# Patient Record
Sex: Female | Born: 1995 | Race: White | Hispanic: No | Smoking: Never smoker
Health system: Southern US, Academic
[De-identification: ages and names within clinical notes are randomized; demographics above are authoritative.]

## PROBLEM LIST (undated history)

## (undated) ENCOUNTER — Emergency Department (HOSPITAL_COMMUNITY): Admission: EM | Payer: No Typology Code available for payment source | Source: Home / Self Care

## (undated) DIAGNOSIS — F99 Mental disorder, not otherwise specified: Secondary | ICD-10-CM

## (undated) DIAGNOSIS — J45909 Unspecified asthma, uncomplicated: Secondary | ICD-10-CM

---
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
## 2015-02-04 ENCOUNTER — Emergency Department: Admission: EM | Admit: 2015-02-04 | Discharge: 2015-02-04 | Discharge status: Against Medical Advice | Payer: 59

## 2015-02-04 DIAGNOSIS — Z5320 Procedure and treatment not carried out because of patient's decision for unspecified reasons: Secondary | ICD-10-CM | POA: Insufficient documentation

## 2015-02-04 MED ORDER — SODIUM CHLORIDE 0.9 % IV BOLUS
1000.00 mL | INJECTION | Status: DC
Start: 2015-02-04 — End: 2015-02-04

## 2015-08-28 ENCOUNTER — Inpatient Hospital Stay (EMERGENCY_DEPARTMENT_HOSPITAL)
Admission: EM | Admit: 2015-08-28 | Discharge: 2015-08-28 | Disposition: A | Discharge status: Home / Self Care | Payer: 59

## 2015-08-28 ENCOUNTER — Emergency Department (HOSPITAL_COMMUNITY): Payer: Self-pay | Admitting: EMERGENCY MEDICINE

## 2015-08-28 DIAGNOSIS — D7389 Other diseases of spleen: Secondary | ICD-10-CM

## 2015-08-28 DIAGNOSIS — R1012 Left upper quadrant pain: Secondary | ICD-10-CM

## 2015-09-23 ENCOUNTER — Inpatient Hospital Stay (EMERGENCY_DEPARTMENT_HOSPITAL)
Admission: EM | Admit: 2015-09-23 | Discharge: 2015-09-23 | Disposition: A | Discharge status: Home / Self Care | Payer: 59

## 2015-09-23 DIAGNOSIS — J069 Acute upper respiratory infection, unspecified: Secondary | ICD-10-CM

## 2017-01-29 ENCOUNTER — Encounter (HOSPITAL_COMMUNITY): Payer: Self-pay

## 2017-01-29 ENCOUNTER — Emergency Department (EMERGENCY_DEPARTMENT_HOSPITAL): Payer: 59

## 2017-01-29 ENCOUNTER — Emergency Department
Admission: EM | Admit: 2017-01-29 | Discharge: 2017-01-29 | Disposition: A | Discharge status: Home / Self Care | Payer: 59 | Attending: Emergency Medicine | Admitting: Emergency Medicine

## 2017-01-29 ENCOUNTER — Emergency Department (HOSPITAL_COMMUNITY): Payer: 59

## 2017-01-29 DIAGNOSIS — R0989 Other specified symptoms and signs involving the circulatory and respiratory systems: Secondary | ICD-10-CM

## 2017-01-29 DIAGNOSIS — R05 Cough: Secondary | ICD-10-CM | POA: Insufficient documentation

## 2017-01-29 DIAGNOSIS — R0981 Nasal congestion: Secondary | ICD-10-CM | POA: Insufficient documentation

## 2017-01-29 DIAGNOSIS — Z79899 Other long term (current) drug therapy: Secondary | ICD-10-CM | POA: Insufficient documentation

## 2017-01-29 DIAGNOSIS — R059 Cough, unspecified: Secondary | ICD-10-CM

## 2017-01-29 LAB — RAPID INFLUENZA A/B AND RSV BY PCR
INFLUENZA VIRUS TYPE A: NEGATIVE
INFLUENZA VIRUS TYPE B: NEGATIVE
RESPIRATORY SYNCYTIAL VIRUS (RSV): NEGATIVE

## 2017-01-29 LAB — HCG, URINE QUALITATIVE, PREGNANCY
HCG URINE QUALITATIVE: NEGATIVE
HCG URINE QUALITATIVE: NEGATIVE

## 2017-01-29 LAB — RAPID THROAT SCREEN, STREPTOCOCCUS, WITH REFLEX: THROAT RAPID SCREEN, STREPTOCOCCUS: NEGATIVE

## 2017-01-29 MED ORDER — METHYLPREDNISOLONE 4 MG TABLETS IN A DOSE PACK
ORAL_TABLET | ORAL | 0 refills | Status: AC
Start: 2017-01-29 — End: ?

## 2017-01-29 MED ORDER — BENZONATATE 100 MG CAPSULE
100.00 mg | ORAL_CAPSULE | Freq: Three times a day (TID) | ORAL | 0 refills | Status: AC
Start: 2017-01-29 — End: 2017-02-03

## 2017-01-29 NOTE — ED Nurses Note (Signed)
Discharge papers and information provided. Pt verbalized understanding and denied any questions on information provided. Pt ambulatory out of ED.

## 2017-01-29 NOTE — ED Nurses Note (Signed)
Pt presents to the ED c/o of cough, sinus pressure, runny nose, and chest congestion past 2-3 days. Pt also reports of muscle aches. Pt denies any other complaints at this time.

## 2017-01-29 NOTE — Discharge Instructions (Signed)
Please return to the ED with any new and/or worsening of your symptoms.      Please take prescribed medications as indicated in as directed.      Please follow up your PCP as needed due to recent ED visit.  You may also be seen by [facility] at any time if you are student.      Please follow all discharge instructions including return precautions.      Thank you.

## 2017-01-29 NOTE — ED Nurses Note (Signed)
Pt ambulatory to xray.

## 2017-01-29 NOTE — ED Nurses Note (Signed)
Pt returned from xray

## 2017-01-29 NOTE — ED Nurses Note (Signed)
Flu and strep swab obtained per order and sent to lab. Pt ambulatory to restroom to provide urine sample

## 2017-01-29 NOTE — ED Provider Notes (Signed)
Department of Emergency Medicine  HPI - 01/29/2017    Attending: Dr. Sheliah Hatch  Resident/Advanced Practice Provider: Mena Goes, APRN    Chief Complaint:   Cough   Nasal Congestion   Body aches  History of Present Illness:   Shelley Carroll, 22 y.o. female   Pt reports to the ED via POV with c/o cough, nasal congestion, and body aches.    Patient is a 22 year old female who presents to the emergency department with cough, nasal congestion, body aches.  Patient states that she has had symptoms for approximately 3 days.  Patient states her symptoms have began to worsen.  Patient states that she took a leftover antibiotics at home over the last 3 days without relief of symptoms.  Patient states that she got a tattoo today and after returning home her symptoms worsened bring her to the emergency department.  Patient states that she is concerned that she may have lung cancer.  Patient has no history of such.  Patient denies daily medications.  Patient denies fever and/or recent injury.      History Limitations:   None    Review of Systems:   Constitutional: No fever, chills, or weakness   Skin: No rashes or diaphoresis   HENT: + congestion   Eyes: No vision changes or discharge   Cardio: No chest pain, palpitations, or leg swelling    Respiratory: + cough, no wheezing or SOB   GI:  No nausea, vomiting, diarrhea, constipation, or abdominal pain   GU:  No dysuria, hematuria, or polyuria   MSK: No joint or back pain, + body aches    Neuro: No loss of sensation, focal deficits, headaches, or LOC   Psych: No SI, HI, AV hallucinations, or substance abuse.     All other systems reviewed and are negative.    Medications:  Prior to Admission Medications   Prescriptions Last Dose Informant Patient Reported? Taking?   artificial tears with lanolin (LACRILUBE) Ophthalmic Ointment   No No   Sig: Instill into both eyes Every 4 hours as needed   diazePAM (VALIUM) 2 mg Oral Tablet   Yes No   Sig: Take 2 mg by mouth  Every 6 hours as needed for Anxiety   oxyCODONE (OXYCONTIN) 15 mg Oral tablet,oral only,ext.rel.12 hr   Yes No   Sig: Take 15 mg by mouth Every 12 hours      Facility-Administered Medications: None       Allergies:  No Known Allergies    Past Medical History:  No past medical history on file.        Past Surgical History:    No pertinent past surgical history reported by patient.        Social History:  Social History     Socioeconomic History   . Marital status: Single     Spouse name: Not on file   . Number of children: Not on file   . Years of education: Not on file   . Highest education level: Not on file   Social Needs   . Financial resource strain: Not on file   . Food insecurity - worry: Not on file   . Food insecurity - inability: Not on file   . Transportation needs - medical: Not on file   . Transportation needs - non-medical: Not on file   Occupational History   . Not on file   Tobacco Use   . Smoking status: Never Smoker   . Smokeless tobacco:  Never Used   Substance and Sexual Activity   . Alcohol use: Not Currently   . Drug use: Never   . Sexual activity: Not on file   Other Topics Concern   . Not on file   Social History Narrative   . Not on file       Family History:  Family Medical History:     None              Physical Exam:  All nurse's notes reviewed.  Filed Vitals:    01/29/17 0031 01/29/17 0259   BP: 118/70 117/75   Pulse: 80 84   Resp: 18 18   Temp: 36.7 C (98 F)    SpO2: 98% 98%          Constitutional: NAD. A+Ox3,   Appears ill however nontoxic.   HENT:    Head: NC AT,   Nasal congestion noted.   Mouth/Throat: Oropharynx is clear and moist.   Slight posterior erythema noted to the oropharynx.   Eyes: PERRL, EOMI, Conjunctivae without discharge.   Neck: Supple.    Cardiovascular: RRR, No murmurs, rubs, or gallops.    Pulmonary/Chest: BS equal bilaterally, good air movement. No respiratory distress. No wheezes, rales, or chest tenderness. Slight cough on exam.   Abdominal: BS +.  Abdomen soft. No tenderness, rebound, or guarding.                Musculoskeletal: No obvious deformity or swelling noted.     Skin: Warm and dry. No rash, erythema, pallor, or cyanosis.   Psychiatric: Behavior is normal. Mood and affect congruent.     Neurological: Alert&Ox3. Grossly intact.     Labs:  No results found for this or any previous visit (from the past 24 hour(s)).    Imaging:  XR CHEST PA AND LATERAL   Final Result   No acute cardiopulmonary abnormality.                         Orders Placed This Encounter   . INFLUENZA A/B AND RSV BY PCR   . RAPID THROAT SCREEN, STREPTOCOCCUS, WITH CULTURE REFLEX   . CANCELED: THROAT CULTURE, BETA HEMOLYTIC STREPTOCOCCUS   . XR CHEST PA AND LATERAL   . HCG, URINE QUALITATIVE, PREGNANCY   . benzonatate (TESSALON) 100 mg Oral Capsule   . Methylprednisolone (MEDROL DOSEPACK) 4 mg Oral Tablets, Dose Pack       Abnormal Lab results:  Labs Reviewed   RAPID INFLUENZA A/B AND RSV BY PCR - Normal   RAPID THROAT SCREEN, STREPTOCOCCUS, WITH REFLEX - Normal   HCG, URINE QUALITATIVE, PREGNANCY - Normal    Narrative:     Sensitivity of the qualitative pregnancy test is 20 mIU hCG/mL.  If pregnancy is strongly considered, perform quantitative hCG or repeat in 48  hours.       ECG:   Not indicated.    Plan: Appropriate labs and imaging ordered. Medical Records reviewed.    Therapy/Procedures/Course/MDM:   Patient was vitally stable throughout visit.    Imaging remarkable for:   See above.  No acute findings.  Lab results remarkable for:   See above.  Preg neg.  Strep neg.  Flu neg.    Patient received:   No medications in the ED.  Prescription for Tessalon a Medrol Dosepak.  Results discussed with patient.  she had improvement with initial ED management. she was given the opportunity to ask questions.  Agreeable to the current plan of care and ER disposition of discharge.  Patient has friend at bedside.  Patient is acting appropriate, talking, and passed p.o. Challenge.   PCP  follow-up as needed.    Medication as prescribed.  Maintain hydration.    Patient follow all discharge instructions including return precautions.    Patient discharged home.  Consults:      None  Impression:   Encounter Diagnoses   Name Primary?   . Cough Yes   . Nasal congestion      Disposition:  Discharged     Following the above history, physical exam, and studies, the patient was deemed stable and suitable for discharge.   she will follow up with  PCP as needed.     Medrol Dosepak and Tessalon was prescribed.  Medication instructions were discussed with the patient   It was advised that the patient return to the ED with any new, concerning or worsening symptoms and follow up as directed.    The patient verbalized understanding of all instructions and had no further questions or concerns.     The supervising physician was physically present and available for consultation, and did not physically see the patient.    Jonell CluckLevi D Wichterman, APRN,NP-C  02/05/2017, 17:59

## 2017-01-31 LAB — THROAT CULTURE, BETA HEMOLYTIC STREPTOCOCCUS: THROAT CULTURE: NORMAL

## 2017-02-05 NOTE — ED Attending Note (Signed)
========================================================================================================================================    I was present and available to evaluate this patient while in the emergency department. This is not an attestation of having personally seen and evaluated the patient, but I was available for consultation.  I was not asked to provide input into this patient's care.  Odis Lusterebra Jo Chapman Matteucci, MD  02/05/2017, 18:07

## 2017-09-01 ENCOUNTER — Encounter (HOSPITAL_BASED_OUTPATIENT_CLINIC_OR_DEPARTMENT_OTHER): Payer: Self-pay | Admitting: Obstetrics & Gynecology

## 2017-09-15 ENCOUNTER — Encounter (HOSPITAL_BASED_OUTPATIENT_CLINIC_OR_DEPARTMENT_OTHER): Payer: Self-pay | Admitting: Student in an Organized Health Care Education/Training Program

## 2017-12-04 ENCOUNTER — Encounter (HOSPITAL_BASED_OUTPATIENT_CLINIC_OR_DEPARTMENT_OTHER): Payer: Self-pay | Admitting: CERTIFIED NURSE MIDWIFE

## 2018-01-27 NOTE — L&D Delivery Note (Signed)
[facility]    Delivery Summary Note      Name: Shelley Carroll   MRN: [id number]  DOB:  06-06-1995  Admitted: 03/11/2018  1:15 PM       Pre-delivery diagnosis:    1. 22 y.o. G4P0030 at [redacted]w[redacted]d gestation.   2. Anxiety/depression    Post-delivery diagnosis:    1. Same plus delivery of a viable neonate.     Findings:           After pushing with mom and mixed maternal expulsive efforts, viable female delivered over an intact perineum in OA position with spontaneous restitution to LOT. Mom then refusing to push further and asking for baby to be "pulled out". Loose nuchal cord reduced. Shoulder clear of pubic bone but maternal efforts weak. After coaching mom able to push baby out. Cord clamped x2 and cut by FOB. Baby to warmer for resuscitation per nursery staff. Cord blood collected and to table. Placenta delivered. Complete, intact with 3vc. Fundus firm to pit and massage. After inspection perineum found to be without laceration. Bleeding minimal.     See below for delivery details.       Disposition:  Infant(s) admitted to floor (rooming in with mother).    Stuckwisch, Girl [[id number]]    Labor Events    Preterm labor?:  No  Antibiotics received during labor?:  No  Rupture date/time:  03/11/2018  Rupture type:  Spontaneous  Fluid color:  Pink  Augmentation:  Oxytocin          Delivery Anesthesia    Method:  Epidural     Lacerations    Episiotomy:  None   Perineal lacerations:  None       Delivery Information    Birth date/time:  03/12/2018 0454  Sex:  Female  Delivery type:  Vaginal, Spontaneous     Newborn Presentation    Presentation:  Vertex  Position:  Occiput Anterior     Cord Information    Vessels:  3 Vessels  Complications:  Nuchal  Nuchal intervention:  reduced  Nuchal cord description:  loose nuchal cord  Number of loops:  1  Cord blood disposition:  Lab, Cord Segment Sent  Gases sent?:  No     Newborn  Apgars    Living status:  Living      Skin color:     Heart rate:     Reflex Irrit:     Muscle tone:       Resp. effort:     Total:      1 Min:      5 Min:      10 Min:      15 Min:      20 Min:           Placenta    Removal:  Spontaneous  Appearance:  Intact  Disposition:  Discarded     Other Delivery Procedures    Procedures:  None     Delivery Providers    Delivering Clinician:  Barnetta Chapel, APRN,MIDWIFE   Provider Role    Registered Nurse    Baby Nurse                      Barnetta Chapel, APRN,MIDWIFE 03/12/2018, 05:32    The patient was seen and independently evaluated by the CNM.      Lennie Muckle, MD

## 2018-03-11 ENCOUNTER — Inpatient Hospital Stay
Admission: EM | Admit: 2018-03-11 | Discharge: 2018-03-14 | DRG: 560 | Disposition: A | Discharge status: Home / Self Care | Payer: No Typology Code available for payment source | Source: Ambulatory Visit | Attending: OBSTETRICS/GYNECOLOGY | Admitting: OBSTETRICS/GYNECOLOGY

## 2018-03-11 ENCOUNTER — Inpatient Hospital Stay (HOSPITAL_COMMUNITY): Payer: 59 | Admitting: OBSTETRICS/GYNECOLOGY

## 2018-03-11 ENCOUNTER — Encounter (HOSPITAL_COMMUNITY): Payer: Self-pay

## 2018-03-11 ENCOUNTER — Encounter (HOSPITAL_COMMUNITY): Payer: Self-pay | Admitting: Student in an Organized Health Care Education/Training Program

## 2018-03-11 ENCOUNTER — Other Ambulatory Visit: Payer: Self-pay

## 2018-03-11 ENCOUNTER — Encounter (HOSPITAL_COMMUNITY): Payer: 59 | Admitting: Student in an Organized Health Care Education/Training Program

## 2018-03-11 DIAGNOSIS — O99344 Other mental disorders complicating childbirth: Secondary | ICD-10-CM | POA: Diagnosis present

## 2018-03-11 DIAGNOSIS — Z9189 Other specified personal risk factors, not elsewhere classified: Secondary | ICD-10-CM

## 2018-03-11 DIAGNOSIS — Z3A38 38 weeks gestation of pregnancy: Secondary | ICD-10-CM

## 2018-03-11 DIAGNOSIS — F4322 Adjustment disorder with anxiety: Secondary | ICD-10-CM | POA: Diagnosis present

## 2018-03-11 DIAGNOSIS — Z3490 Encounter for supervision of normal pregnancy, unspecified, unspecified trimester: Secondary | ICD-10-CM

## 2018-03-11 DIAGNOSIS — F329 Major depressive disorder, single episode, unspecified: Secondary | ICD-10-CM | POA: Diagnosis present

## 2018-03-11 HISTORY — DX: Mental disorder, not otherwise specified: F99

## 2018-03-11 HISTORY — DX: Unspecified asthma, uncomplicated: J45.909

## 2018-03-11 LAB — ALT (SGPT): ALT (SGPT): 13 U/L (ref <55)

## 2018-03-11 LAB — CREATININE WITH EGFR
CREATININE: 0.74 mg/dL (ref 0.49–1.10)
ESTIMATED GFR: >60 mL/min/1.73mˆ2 (ref >60)

## 2018-03-11 LAB — CBC
HCT: 38.1 % (ref 34.8–46.0)
HGB: 12.8 g/dL (ref 11.5–16.0)
MCH: 27.5 pg (ref 26.0–32.0)
MCH: 27.5 pg (ref 26.0–32.0)
MCHC: 33.6 g/dL (ref 31.0–35.5)
MCV: 81.8 fL (ref 78.0–100.0)
MPV: 10.9 fL (ref 8.7–12.5)
PLATELETS: 230 x10ˆ3/uL (ref 150–400)
RBC: 4.66 x10ˆ6/uL (ref 3.85–5.22)
RDW-CV: 14.1 % (ref 11.5–15.5)
WBC: 16.6 x10?3/uL — ABNORMAL HIGH (ref 3.7–11.0)
WBC: 16.6 x10ˆ3/uL — ABNORMAL HIGH (ref 3.7–11.0)

## 2018-03-11 LAB — HEPATITIS C ANTIBODY SCREEN WITH REFLEX TO HCV PCR: HCV ANTIBODY QUALITATIVE: NEGATIVE

## 2018-03-11 LAB — HIV1/HIV2 SCREEN, COMBINED ANTIGEN AND ANTIBODY: HIV SCREEN, COMBINED ANTIGEN & ANTIBODY: NEGATIVE

## 2018-03-11 LAB — DRUG SCREEN, NO CONFIRMATION, URINE
AMPHETAMINES URINE: NEGATIVE
BARBITURATES URINE: NEGATIVE
BENZODIAZEPINES URINE: NEGATIVE
BUPRENORPHINE URINE: NEGATIVE
CANNABINOIDS URINE: NEGATIVE
COCAINE METABOLITES URINE: NEGATIVE
CREATININE RANDOM URINE: 18 mg/dL
ECSTASY/MDMA URINE: NEGATIVE
METHADONE URINE: NEGATIVE
OPIATES URINE (LOW CUTOFF): NEGATIVE
OXIDANT-ADULTERATION: NEGATIVE
OXYCODONE URINE: NEGATIVE
PH-ADULTERATION: 6.6 (ref 4.5–9.0)
SPECIFIC GRAVITY-ADULTERATION: 1.005 g/mL (ref 1.005–1.030)
SPECIFIC GRAVITY-ADULTERATION: 1.005 g/mL (ref 1.005–1.030)

## 2018-03-11 LAB — TYPE AND SCREEN
ABO/RH(D): AB POS
ANTIBODY SCREEN: NEGATIVE

## 2018-03-11 LAB — URIC ACID: URIC ACID: 4.8 mg/dL (ref 2.9–6.3)

## 2018-03-11 LAB — ELECTROLYTES
ANION GAP: 10 mmol/L (ref 4–13)
CHLORIDE: 106 mmol/L (ref 96–111)
CO2 TOTAL: 21 mmol/L — ABNORMAL LOW (ref 22–32)
POTASSIUM: 3.7 mmol/L (ref 3.5–5.1)
SODIUM: 137 mmol/L (ref 136–145)

## 2018-03-11 LAB — AST (SGOT): AST (SGOT): 29 U/L (ref 8–41)

## 2018-03-11 LAB — HEPATITIS B SURFACE ANTIBODY: HBV SURFACE ANTIBODY QUANTITATIVE: 0 m[IU]/mL (ref <8)

## 2018-03-11 LAB — LDH: LDH: 273 U/L — ABNORMAL HIGH (ref 125–220)

## 2018-03-11 MED ORDER — SODIUM CHLORIDE 0.9 % (FLUSH) INJECTION SYRINGE
2.0000 mL | INJECTION | INTRAMUSCULAR | Status: DC | PRN
Start: 2018-03-11 — End: 2018-03-12

## 2018-03-11 MED ORDER — LIDOCAINE HCL 10 MG/ML (1 %) INJECTION SOLUTION
2.0000 mL | Freq: Once | INTRAMUSCULAR | Status: DC | PRN
Start: 2018-03-11 — End: 2018-03-14

## 2018-03-11 MED ORDER — FENTANYL 2MCG/ML + ROPIVACAINE 0.2% IN NS 250ML (TOT VOL) PCEA INFUSION
INJECTION | Status: DC
Start: 2018-03-11 — End: 2018-03-14
  Administered 2018-03-11: 10 mL/h via EPIDURAL
  Filled 2018-03-11: qty 250

## 2018-03-11 MED ORDER — METHYLERGONOVINE 0.2 MG/ML (1 ML) INJECTION SOLUTION
0.2000 mg | Freq: Once | INTRAMUSCULAR | Status: DC | PRN
Start: 2018-03-11 — End: 2018-03-14

## 2018-03-11 MED ORDER — SODIUM CHLORIDE 0.9 % (FLUSH) INJECTION SYRINGE
2.0000 mL | INJECTION | Freq: Three times a day (TID) | INTRAMUSCULAR | Status: DC
Start: 2018-03-11 — End: 2018-03-12
  Administered 2018-03-11 (×2): 0 mL

## 2018-03-11 MED ORDER — OXYTOCIN 30 UNIT/500 ML IN 0.9 % SODIUM CHLORIDE INTRAVENOUS
1.0000 m[IU]/min | INTRAVENOUS | Status: DC
Start: 2018-03-12 — End: 2018-03-12
  Administered 2018-03-12: 1 m[IU]/min via INTRAVENOUS
  Filled 2018-03-11: qty 500

## 2018-03-11 MED ORDER — MISOPROSTOL 200 MCG TABLET
1000.0000 ug | ORAL_TABLET | Freq: Once | ORAL | Status: DC | PRN
Start: 2018-03-11 — End: 2018-03-14

## 2018-03-11 MED ORDER — CARBOPROST TROMETHAMINE 250 MCG/ML INTRAMUSCULAR SOLUTION
250.0000 ug | Freq: Once | INTRAMUSCULAR | Status: DC | PRN
Start: 2018-03-11 — End: 2018-03-14

## 2018-03-11 MED ORDER — LACTATED RINGERS INTRAVENOUS SOLUTION
INTRAVENOUS | Status: DC
Start: 2018-03-11 — End: 2018-03-14
  Administered 2018-03-12: 0 via INTRAVENOUS

## 2018-03-11 MED ORDER — LIDOCAINE HCL 10 MG/ML (1 %) INJECTION SOLUTION
Freq: Once | INTRAMUSCULAR | Status: DC | PRN
Start: 2018-03-11 — End: 2018-03-12
  Administered 2018-03-11: 4 mL

## 2018-03-11 MED ORDER — LIDOCAINE-EPINEPHRINE (PF) 1.5 %-1:200,000 INJECTION SOLUTION
Freq: Once | INTRAMUSCULAR | Status: DC | PRN
Start: 2018-03-11 — End: 2018-03-12
  Administered 2018-03-11: 3 mL via EPIDURAL

## 2018-03-11 MED ORDER — ROPIVACAINE (PF) 2 MG/ML (0.2 %) INJECTION SOLUTION
Freq: Once | INTRAMUSCULAR | Status: DC | PRN
Start: 2018-03-11 — End: 2018-03-12
  Administered 2018-03-11 (×2): 5 mL

## 2018-03-11 MED ORDER — MINERAL OIL ORAL
30.00 mL | TOPICAL_OIL | Freq: Every day | ORAL | Status: DC | PRN
Start: 2018-03-11 — End: 2018-03-14
  Administered 2018-03-12: 30 mL via ORAL
  Filled 2018-03-11: qty 60

## 2018-03-11 MED ADMIN — mupirocin 2 % topical ointment: @ 16:00:00 | NDC 68462018022

## 2018-03-11 NOTE — Nurses Notes (Signed)
Order received for discharge.  Patient to go to [facility], which is where she receives her OB care, to be evaluated for labor.  Patient is leaving with significant other.

## 2018-03-11 NOTE — Anesthesia Preprocedure Evaluation (Addendum)
ANESTHESIA PRE-OP EVALUATION  Planned Procedure: ANES - LABOR ANALGESIA  Review of Systems     anesthesia history negative     patient summary reviewed  nursing notes reviewed        Pulmonary   asthma (no active medications/asymptomatic),   Cardiovascular  negative cardio ROS,  NYHA Classification:I   Exercise Tolerance: > or = 4 METS        GI/Hepatic/Renal   negative GI/hepatic/renal ROS,      Endo/Other   neg endo/other ROS,        Neuro/Psych/MS   negative neuro/psych ROS,      Cancer  negative hematology/oncology ROS,                  Physical Assessment      Patient summary reviewed and Nursing notes reviewed   Airway       Mallampati: II    TM distance: >3 FB    Neck ROM: full  Mouth Opening: good.  No Facial hair  No Beard  No endotracheal tube present  No Tracheostomy present    Dental       Dentition intact             Pulmonary    Breath sounds clear to auscultation  (-) no rhonchi, no decreased breath sounds, no wheezes, no rales and no stridor     Cardiovascular    Rhythm: regular  Rate: Normal  (-) no friction rub, carotid bruit is not present and no murmur     Other findings            Plan  Planned anesthesia type: epidural        ASA 2         Anesthetic plan and risks discussed with patient.     Anesthesia issues/risks discussed are: Spinal Headache, Failure of Block, Nerve Injuries, Cardiac Events/MI, Local Anesthetic Systemic Toxicity, High Neuraxial Block and PONV.    Use of blood products discussed with patient who consented to blood products.                Plan discussed with resident and attending.

## 2018-03-11 NOTE — Discharge Instructions (Addendum)
Patient Education   Patient Education     After a Vaginal Birth    After having a baby, your body may be very tired. It can take time to recover from a vaginal delivery. You may stay in the hospital or birth center from 1 to 4 days.In some cases, you may be able to go home the same day.  Right after the delivery  Your temperature and blood pressure will be taken until they are stable. A nurse or other healthcare provider will observe you as you rest. You may have afterbirth pains. These are cramps caused by the uterus shrinking. Sanitary pads are used to soak up the discharge of the uterine lining. To make sure that you aren't bleeding too much, the pad will be checked. And the firmness of your uterus will be checked. To do this, a nurse will gently push down on your stomach. If you had anesthesia, you'll be watched closely until you can feel and move your toes. If you have perineal pain (pain between the vagina and anus), an ice pack can help.  Newborn care  While still in the hospital or birth center, you'll learn how to hold and feed your baby. You willalso be given instructions on how to care for your baby. This includes bathing and feeding.  Preparing to go home  You may be anxious to go home as soon as possible. Before you and your baby go home, a healthcare provider will check to be sure you are healthy enough to take care of your baby and yourself. You're ready to go home when:   You can walk to the bathroom and use the bathroom without help.   You can eat solid food and swallow pills (if needed).   You have no sign of infection or other health problems, including fever.   You have adequate pain control.   Your bleeding isn't excessive.   You are able to care for your newborn and are emotionally stable.  Before leaving the hospital or birth center, you'll be given written instructions for home self-care after vaginal delivery. Be sure to follow these instructions carefully. If you have questions or  concerns, talk about them now.  If you have stitches  You may have received stitches in the skin near your vagina. The stitches might have closed an episiotomy (an incision that enlarges the opening of the vagina). Or you may have needed stitches to repair torn skin. Either way, your stitches should dissolve within weeks. Until then, you can help reduce discomfort, aid healing, and reduce your risk of infection by keeping the stitches clean. These tips can help:   Gently wipe from front to back after you urinate or have a bowel movement.   After wiping, spray warm water on the area. Or you can have a sitz bath. This means sitting in a tub with a few inches of water in it. Then pat the area dry or use a hairdryer on a cool setting.   Do not use soap or any solution except water on the area.   You can take a shower unless told not to.   Change sanitary pads at least every 2 to 4 hours.   Place cold or heat packs on the area as directed by your healthcare providers or nurses. Keep a thin towel between the pack and your skin.   Sit on firm seats so the stitches pull less.  Postnatal follow-up  Schedule a postnatal follow-up exam with your   healthcare provider for about 6 weeks after delivery. During this exam, your uterus and vaginal area will be checked. Contact your healthcare provider if you think you or your baby are having any problems.  When to call your healthcare provider  Call your healthcare provider right away if you have:   A fever of 100.4F (38.0C) or higher   Bleeding that needs a new sanitary pad after an hour, or large blood clots   Pain in your vagina that gets worse and isn't relieved with medicine   Swelling, discharge, or increased pain from vaginal tear or episiotomy   Burning, pain, red streaks, or lumpy areas in your breasts that may be accompanied by flu-like symptoms   Cracks, blisters, or blood on your nipples   Burning or pain when you urinate   Nausea or vomiting   Dizziness  or fainting   Feelings of extreme sadness or anxiety, or a feeling that you don't want to be with your baby   Belly pain that isn't relieved with medicine   Vaginal discharge that has a bad odor   No bowel movement for 5 days   Painful urination, or inability to control urination   Redness, warmth, or pain in the lower leg   Chest pain  [employer] last reviewed this educational content on 12/28/2015   2000-2019 [employer] [address], [place] [address]. All rights reserved. This information is not intended as a substitute for professional medical care. Always follow your healthcare professional's instructions.           Understanding Postpartum Depression  You've just had a baby. You expected to be excited and happy. But instead you find yourself crying for no reason. You may have trouble coping with your daily tasks. You feel sad, tired, and hopeless most of the time. You may even feel ashamed or guilty. But what you're going through is not your fault and you can feel better. Talk to your healthcare provider. He or she can help.    What is depression?  Depression is a mood disorder that affects the way you think and feel. The most common symptom is a feeling of deep sadness. You may also feel as if you just can't cope with life. Other symptoms include:   Gaining or losing a lot of weight   Sleeping too much or too little   Feeling tired all the time   Feeling restless   Crying a lot   Having too little or too much appetite.   Withdrawing from friends and family   Having headaches, aches and pains, or stomach problems that won't go away.   Fears of harming your baby   Lack of interest in your baby   Feeling worthless or guilty   No longer finding pleasure in things you used to   Having trouble thinking clearly or making decisions   Thinking about death or suicide  Depression after childbirth  You may be weepy and tired right after giving birth. These feelings are normal.  They're sometimes called the "baby blues." These blues go away after 1 to 2 weeks. However, postpartum (meaning "after birth") depression lasts much longer and is more severe than the "baby blues." It can make you feel sad and hopeless. You may also fear that your baby will be harmed and worry about being a bad mother.  What causes postpartum depression?  The exact cause of postpartum depression is unknown. Changes in brain chemistry or structure   are believed to play a big role in depression.It may be due to changes in your hormones during and after childbirth. You may also be tired from caring for your baby and adjusting to being a mother. All these factors may make you feel depressed. In some cases, your genes may also play a role.  Depression can be treated  There are many ways to treat postpartum depression. Talking to your healthcare provider is the first step toward feeling better.  When to call your healthcare provider  Call your healthcare provider if you:   Cry for no clear reason   Have trouble sleeping, eating, and making choices   Questions whether you can handle caring for a baby   Have intense feelings of sadness, anxiety, or despair that prevent you from being able to do your daily tasks  Resources   National Institute of Mental Health866-615-6464www.nimh.nih.gov   National Alliance on Mental Illness800-950-6264www.nami.org   Mental Health America800-969-6642www.nmha.org   National Suicide Hotline800-784-2433 (800-SUICIDE)    [employer] last reviewed this educational content on 07/28/2015   2000-2019 [employer] [address], [place] [address]. All rights reserved. This information is not intended as a substitute for professional medical care. Always follow your healthcare professional's instructions.

## 2018-03-11 NOTE — Progress Notes (Signed)
L&D Labor Progress Note: CNM  PATIENT: Shelley Carroll  CHART NUMBER: [id number]  DoB: 1995/05/02   DATE OF SERVICE: 03/11/2018, 22:12      S: Patient in bed, with good pain control from epidural, sitting upright in bed rocking hips. Reports she cannot feel contractions at all, but likes feeling like she is still moving.     O:   Filed Vitals:    03/11/18 1756 03/11/18 1849 03/11/18 2011 03/11/18 2208   BP: 128/62 111/85 119/63 129/81   Pulse: 100 94 92 (!) 104   Resp:  16 18    Temp:  36.9 C (98.4 F) 36.5 C (97.7 F)    SpO2:           SVE: 7/90/-1    Variability: Minimal with some periods of moderate  Baseline: 150  Accelerations: present  Decelerations: none  Toco:q 2-5 min    A/P: 23 y.o. G4P0030 @ Unknown     Active labor   SVE 7/90/-1  EFM Cat 1   CTX q 2-5 min on Toco  Can consider pitocin if her contractions start to space out.   Will try peanut ball in exaggerated simms.     Barnetta Chapel, APRN,MIDWIFE  03/11/2018, 22:12  The patient was seen and independently evaluated by the CNM.      Lennie Muckle, MD

## 2018-03-11 NOTE — Anesthesia Procedure Notes (Signed)
Lumbar Epidural   Indication: pain relief in labor and delivery        Pt location: At bedside  Technique: ( See MAR for exact doses)  Technique/Approach: midline      Needle Level: L4-5  Sterile Skin Prep : cap, draped, hand hygiene performed, mask, sterile drape, sterile field established, sterile gloves, sterile technique and sterilely prepped and draped     Site verified, H&P updated and consent obtained, Patient monitors applied, Timeout performed, Emergency drugs and equipment available, Patient positioned and anesthesia consent given   Patient position: sitting   Skin local: Lidocaine 1%   Needle/Catheter: Needle type: Hustead   Needle Gauge: 18 G  Needle length: 3.5 in    Needle insertion depth 5.5 cm Catheter length in space: 5 cm   Catheter at skin depth: 10.5 cm  Epidural catheter location: lumbar (1-5)  Number of attempts: 1  Events: neg aspiration and no complications,         Dosing:       lidocaine 1.5% with epinephrine 1:200,000       Negative test - not INTRAVENOUS  and Negative test - not Subarachnoid     Catheter: secured         Patient response: comfortable and adequate sensory block    Performed by:    Performing Provider:  Jasmine Pang, MD  Authorizing Provider:  Erlene Senters, MD

## 2018-03-11 NOTE — H&P (Signed)
[facility] of Obstetrics & Gynecology      Obstetrics History and Physical    PATIENT:  Shelley Carroll   MRN:  [id number]   DATE OF SERVICE:  03/11/2018, 14:04      PRIMARY OB:  [facility]      CC:  Contractions    HPI:  Kamiylah Meece is a 23 y.o. G1P0 at [redacted]w[redacted]d GA by LMP and [place] who presents to labor and delivery for contractions.  She reports she got all her prenatal care there and was just unsure about where to go when they told her to go to the hospital in labor. After some thought, she says she would like to go to [facility] if she can. Later became very uncomfortable, and saying she "feels like she might die" and writhing on the bed pain during contractions. Then decided to stay and be admitted here.     ROD:  Based on sure LMP    OBHx:  Lab Results   Component Value Date    HGB 12.8 03/11/2018    HGB 12.1 06/08/2014    HCT 38.1 03/11/2018    HCT 36.8 06/08/2014     Rubella:  immune     HIV:  neg     VDRL:  neg     Hep B SAG:  neg     GC:  neg     GBS:  negative    OB History   Gravida Para Term Preterm AB Living   4   0 0 3 0   SAB TAB Ectopic Multiple Live Births   0 3 0 0        # Outcome Date GA Lbr Len/2nd Weight Sex Delivery Anes PTL Lv   4 Current            3 TAB            2 TAB            1 TAB                PMHx:  No past medical history on file.      FamHx:   Father with heart disease, denies hx of bleeding or genetics disorders      SocHx:  Denies drug use  Social History     Tobacco Use   . Smoking status: Never Smoker   . Smokeless tobacco: Never Used   Substance Use Topics   . Alcohol use: Not Currently   . Drug use: Never   Vaping earlier in prenancy     CURRENT MEDS:   Medications Prior to Admission     Prescriptions    Methylprednisolone (MEDROL DOSEPACK) 4 mg Oral Tablets, Dose Pack    Take as instructed.           ALLERGIES:  Patient has no known allergies.     REVIEW OF SYSTEMS: Other than ROS in the HPI, all other systems were negative.    PHYSICAL EXAMINATION:   Filed  Vitals:    03/11/18 1540 03/11/18 1541 03/11/18 1543 03/11/18 1546   BP: (!) 137/97  126/68 132/69   Pulse: 96 97 97 93   SpO2:  100%  100%     Check Vitals  GEN:  appears in good health, normal mood and affect  HEENT:  EOMI, CN II-XII grossly intact  CV:  regular rate and rhythm  RESP:  Clear to auscultation bilaterally.   ABD:  Soft, non-tender; gravid  EXT:  No cyanosis or edema  NEURO:  2+ reflexes bilateral patellar  SKIN:  No apparent lesions, no rash    SVE:  2/90/-1  Grossly ruptured on exam with small amount of pink tinged fluid  PRESENTATION:  Cephalic by leopolds  EFW:   At 18th percentile 2/11 [place] per [facility]    FHRT:  155 baseline, mod and minimal variability, pos accels, variable decels  TOCO:  q 2-4 min    ASSESSMENT/PLAN:  23 y.o. G1P0 at [redacted]w[redacted]d dated by LMP    PNC   ABPOS per records   PNL WNL   Continue PNV    1.  Labor    Admit to floor    Continuous monitoring   IV fluids   Clear liquid diet   PN labs ordered as records from [facility] incomplete   Anticipate NSVD     2.  Pain control    Anesthesia aware, patient consented    Barnetta Chapel, APRN,MIDWIFE           I reviewed the APP's note.  I agree with the findings and plan of care as documented in the APP's note.  Any exceptions/additions are edited/noted.    Delmar Landau, MD

## 2018-03-12 ENCOUNTER — Encounter (HOSPITAL_COMMUNITY): Payer: Self-pay

## 2018-03-12 DIAGNOSIS — Z3A38 38 weeks gestation of pregnancy: Secondary | ICD-10-CM

## 2018-03-12 MED ORDER — SODIUM CHLORIDE 0.9 % (FLUSH) INJECTION SYRINGE
2.0000 mL | INJECTION | INTRAMUSCULAR | Status: DC | PRN
Start: 2018-03-12 — End: 2018-03-14

## 2018-03-12 MED ORDER — IBUPROFEN 600 MG TABLET
600.0000 mg | ORAL_TABLET | Freq: Four times a day (QID) | ORAL | Status: DC
Start: 2018-03-12 — End: 2018-03-12

## 2018-03-12 MED ORDER — OXYTOCIN 30 UNIT/500 ML IN 0.9 % SODIUM CHLORIDE INTRAVENOUS
334.0000 m[IU]/min | INTRAVENOUS | Status: DC
Start: 2018-03-12 — End: 2018-03-12

## 2018-03-12 MED ORDER — ACETAMINOPHEN 325 MG TABLET
650.0000 mg | ORAL_TABLET | ORAL | Status: DC | PRN
Start: 2018-03-12 — End: 2018-03-14

## 2018-03-12 MED ORDER — DOCUSATE SODIUM 100 MG CAPSULE
100.0000 mg | ORAL_CAPSULE | Freq: Two times a day (BID) | ORAL | Status: DC | PRN
Start: 2018-03-12 — End: 2018-03-14
  Administered 2018-03-12: 100 mg via ORAL
  Filled 2018-03-12: qty 1

## 2018-03-12 MED ORDER — LIDOCAINE HCL 20 MG/ML (2 %) INJECTION SOLUTION
INTRAMUSCULAR | Status: DC
Start: 2018-03-12 — End: 2018-03-12
  Filled 2018-03-12: qty 20

## 2018-03-12 MED ORDER — SODIUM CHLORIDE 0.9 % (FLUSH) INJECTION SYRINGE
2.0000 mL | INJECTION | Freq: Three times a day (TID) | INTRAMUSCULAR | Status: DC
Start: 2018-03-12 — End: 2018-03-14
  Administered 2018-03-12: 2 mL
  Administered 2018-03-12 – 2018-03-14 (×6): 0 mL

## 2018-03-12 MED ORDER — LANOLIN-OXYQUIN-PET, HYDROPHIL TOPICAL OINTMENT
TOPICAL_OINTMENT | Freq: Two times a day (BID) | CUTANEOUS | Status: DC | PRN
Start: 2018-03-12 — End: 2018-03-14
  Filled 2018-03-12: qty 1

## 2018-03-12 MED ORDER — HYDROCODONE 5 MG-ACETAMINOPHEN 325 MG TABLET
1.0000 | ORAL_TABLET | ORAL | Status: DC
Start: 2018-03-12 — End: 2018-03-12

## 2018-03-12 MED ORDER — KETOROLAC 30 MG/ML (1 ML) INJECTION SOLUTION
30.0000 mg | Freq: Once | INTRAMUSCULAR | Status: AC
Start: 2018-03-12 — End: 2018-03-12
  Administered 2018-03-12: 30 mg via INTRAVENOUS
  Filled 2018-03-12: qty 1

## 2018-03-12 MED ORDER — BISACODYL 10 MG RECTAL SUPPOSITORY
10.0000 mg | Freq: Once | RECTAL | Status: AC | PRN
Start: 2018-03-12 — End: 2018-03-12

## 2018-03-12 MED ORDER — IBUPROFEN 600 MG TABLET
600.00 mg | ORAL_TABLET | Freq: Four times a day (QID) | ORAL | Status: DC
Start: 2018-03-12 — End: 2018-03-14
  Administered 2018-03-12: 0 mg via ORAL
  Administered 2018-03-12: 600 mg via ORAL
  Administered 2018-03-13 (×2): 0 mg via ORAL
  Administered 2018-03-13: 600 mg via ORAL
  Administered 2018-03-13: 0 mg via ORAL
  Administered 2018-03-14 (×2): 600 mg via ORAL
  Filled 2018-03-12 (×7): qty 1

## 2018-03-12 MED ORDER — GLYCERIN-WITCH HAZEL 12.5 %-50 % TOPICAL PADS
MEDICATED_PAD | CUTANEOUS | Status: DC | PRN
Start: 2018-03-12 — End: 2018-03-14
  Filled 2018-03-12: qty 40

## 2018-03-12 MED ORDER — ONDANSETRON HCL (PF) 4 MG/2 ML INJECTION SOLUTION
4.0000 mg | Freq: Once | INTRAMUSCULAR | Status: DC | PRN
Start: 2018-03-12 — End: 2018-03-14

## 2018-03-12 MED ORDER — ONDANSETRON HCL 4 MG TABLET
4.0000 mg | ORAL_TABLET | Freq: Three times a day (TID) | ORAL | Status: AC | PRN
Start: 2018-03-12 — End: 2018-03-13

## 2018-03-12 NOTE — Care Management Notes (Signed)
[facility] Management Initial Evaluation    Patient Name: Shelley Carroll  Date of Birth: Dec 03, 1995  Sex: female  Date/Time of Admission: 03/11/2018  1:15 PM  Room/Bed: 629/A  Payor: Theme park manager / Plan: Theme park manager [facility] / Product Type: Non Managed Care /   Primary Care Providers:  Pcp, No (General)    Pharmacy Info:   Preferred Pharmacy     None        Emergency Contact Info:   Extended Emergency Contact Information  Primary Emergency Contact: Shelley Carroll [place] [employer] Phone: [phone]  Relation: None    History:   Shelley Carroll is a 23 y.o., female, admitted for term pregnancy.     Height/Weight: 162.6 cm (_0 ) / 68 kg (150 lb)     LOS: 1 day   Admitting Diagnosis: Term pregnancy [Z34.90]    Assessment:      03/12/18 1610   Assessment Details   Assessment Type Admission   Date of Care Management Update 03/12/18   Date of Next DCP Update 03/15/18   Readmission   Is this a readmission? No   Care Management Plan   Discharge Planning Status initial meeting   Projected Discharge Date 03/14/18   Discharge plan discussed with: Patient;Spouse   Discharge Needs Assessment   Outpatient/Agency/Support Group Needs   (none)   Equipment Currently Used at Home none   Equipment Needed After Discharge none   Discharge Facility/Level of Care Needs Home (Patient/Family Member/other)(code 1)   Transportation Available car;family or friend will provide   Referral Information   Admission Type inpatient   Address Verified verified-no changes   Arrived From home or self-care   Insurance Verified verified-no change   ADVANCE DIRECTIVES   Does the Patient have an Advance Directive? No, Information Offered and Refused   Patient Requests Assistance in Having Advance Directive Notarized. N/A   LAY CAREGIVER    Appointed Lay Caregiver? I Decline   Employment/Financial   Patient has Prescription Coverage?  Yes        Name of Insurance Coverage for Medications [place] an age group to open "lives with" row.  Adult   Lives With significant other   Living Arrangements apartment   Able to Return to Prior Arrangements yes   Living Arrangement Comments resides with s/o   Home Safety   Home Assessment: No Problems Identified   Home Accessibility no concerns     Pt is a 23 year old G1 now P1 delivered at 110w4dgestation. Viable infant girl delivered via SVD on 03/12/18 at 0454, weighing 6 lbs 11.2 oz  with Apgars of 5, 9 , and 9. Pt is bottle feeding and infant is rooming in. Infant to be named Shelley Carroll  MSW met with pt at bedside to complete assessment. Pt resides with s/o- Shelley Carroll. Pt is unemployed and s/o is employed at [employer] [place]. Pt reports having all necessary items for infant such as crib, car seat, etc. Car seat to be brought to room. [facility] U[facility] (-). Shelley Carroll to provide transportation at d/c. Concerns from multiple people in regards to pt's interactions with infant. Pt not caring for or interacting with  infant. FOB providing all care for infant. MSW discussed safe sleep for infant and things to look for in regards to post partum depression such as extreme sadness, crying, not bonding with baby or taking care of baby, etc. Discussed normal emotions vs concerning emotions. MSW asked pt how she was feeling mentally/emotionally and pt responded that she was "hungry". FOB asked if he had any concerns to which he stated No. Pt does report a hx of anxiety and depression OB, Newborn, team, and bedside nurse notified of above. Pt could benefit from psych consult. No d/c needs identified at this time. Anticipate d/c to home. Will follow.     Discharge Plan:  Home (Patient/Family Member/other) (code 1)  Pt s/p SVD. Pt likely to d/c 48  hours after delivery if there are no complications.     The patient will continue to be evaluated for developing discharge needs.     Case Manager: Lanae Crumbly, [place]  Phone: [phone]

## 2018-03-12 NOTE — Progress Notes (Signed)
L&D Labor Progress Note: CNM  PATIENT: Shelley Carroll  CHART NUMBER: [id number]  DoB: 05-May-1995   DATE OF SERVICE: 03/12/2018, 02:10      S: Patient in bed in a variety of position, feeling good pain relief from epidural.     O:   Filed Vitals:    03/12/18 0105 03/12/18 0110 03/12/18 0115 03/12/18 0131   BP: 122/76   128/72   Pulse: (!) 104 (!) 101 (!) 111 (!) 102   Resp: 16      Temp: 36.8 C (98.2 F)      SpO2: 100% 100% 100%        SVE: 7-8/c/0    Variability: Moderate  Baseline: 150  Accelerations: present  Decelerations: repetitive deep variables with contractions. With some to the 60s.  Toco: q 3-5 min    A/P: 23 y.o. G4P0030 @ [redacted]w[redacted]d     Active labor  SVE as above  EFM Cat 2  CTX q 3-5 min on Toco  Continue with position changes   Anticipate NSVD    Barnetta Chapel, APRN,MIDWIFE  03/12/2018, 02:10    The patient was seen and independently evaluated by the CNM.      Lennie Muckle, MD

## 2018-03-12 NOTE — Progress Notes (Signed)
L&D Labor Progress Note: CNM  PATIENT: Shelley Carroll  CHART NUMBER: [id number]  DoB: 1995/09/08   DATE OF SERVICE: 03/12/2018, 01:05      S: Patient in bed with shivering, still feeling comfortable with epidural for contractions. Reports she is feeling worried about needing a cesarean section.     O:   Filed Vitals:    03/11/18 1756 03/11/18 1849 03/11/18 2011 03/11/18 2208   BP: 128/62 111/85 119/63 129/81   Pulse: 100 94 92 (!) 104   Resp:  16 18 16    Temp:  36.9 C (98.4 F) 36.5 C (97.7 F)    SpO2:           SVE: 7-8/90/0     Variability: Moderate and some periods of minimal  Baseline: 150  Accelerations: present  Decelerations: variable, ealy  Toco:q 3-4 min    A/P: 23 y.o. G4P0030 @ 38+4     Active labor  SVE 7-8/100/0  EFM Cat 2  CTX q 3-4 min on Toco  Can continue to consider augmentation and IUPC/FSE for issues with monitoring.     Barnetta Chapel, APRN,MIDWIFE  03/12/2018, 01:05    The patient was seen and independently evaluated by the CNM.      Lennie Muckle, MD

## 2018-03-12 NOTE — Care Plan (Signed)
Pt s/p SVD. Pt likely to d/c 48 hours after delivery if there are no complications.     The patient will continue to be evaluated for developing discharge needs.

## 2018-03-12 NOTE — Progress Notes (Signed)
L&D Labor Progress Note: CNM  PATIENT: Shelley Carroll  CHART NUMBER: [id number]  DoB: [birth year]   DATE OF SERVICE: 03/12/2018, 03:03      S: patient on left side with some increased pain through epidural. Is feeling more pressure.     O:   Filed Vitals:    03/12/18 0105 03/12/18 0110 03/12/18 0115 03/12/18 0131   BP: 122/76   128/72   Pulse: (!) 104 (!) 101 (!) 111 (!) 102   Resp: 16      Temp: 36.8 C (98.2 F)      SpO2: 100% 100% 100%        SVE: 9/100/0    Variability: Moderate with some periods of minimal   Baseline: 150     Accelerations: present  Decelerations: variables with contractions  Toco:q 3-5 min    A/P: 23 y.o. G4P0030 @ Unknown     Active labor  SVE 9/100/0  EFM Cat 2  CTX q 3-5 min on Toco  Continue with position changes to help with increased pain on one side  Anticipate NSVD    Barnetta Chapel, APRN,MIDWIFE  03/12/2018, 03:03  The patient was seen and independently evaluated by the CNM.      Lennie Muckle, MD

## 2018-03-12 NOTE — Anesthesia Postprocedure Evaluation (Signed)
Anesthesia Post Op Evaluation    Patient: Shelley Carroll  ANES - LABOR ANALGESIA    Last Vitals:Temperature: 37 C (98.6 F) (03/12/18 0714)  Heart Rate: 92 (03/12/18 0714)  BP (Non-Invasive): 128/70 (03/12/18 0714)  Respiratory Rate: 16 (03/12/18 0714)  SpO2: 100 % (03/12/18 0115)    Patient is sufficiently recovered from the effects of anesthesia to participate in the evaluation and has returned to their pre-procedure level.  Patient location during evaluation: bedside   Post-procedure handoff checklist completed    Patient participation: complete - patient participated  Level of consciousness: awake and alert  Pain score: 1  Pain management: adequate  Airway patency: patent  Anesthetic complications: no  Cardiovascular status: acceptable and stable  Respiratory status: acceptable and room air  Hydration status: acceptable  Patient post-procedure temperature: Pt Normothermic   PONV Status: Absent      Pt fully recovered neurologically after LA since epidural removal.    Renard Hamper, MD   PGY-3 CA-2  Department of Anesthesiology    I was present for the evaluation of this patient. I reviewed the resident's note.  I agree with the findings and plan of care as documented in the resident's note.  Any exceptions/additions are edited/noted.    Reita Chard, MD 03/12/2018, 12:34  Assistant Professor  Department of Anesthesiology  [facility]

## 2018-03-12 NOTE — Progress Notes (Signed)
[facility]  Oxytocin Labor Augmentation Bundle Requirments    Date of Service: 03/12/2018      Estimated Fetal Weight:  2800  Fetal Heart Rate Monitoring:  Baseline - 150 bpm    Variability - mod  Decelerations:  Early and Variable Mild  Uterine Contraction Frequency:  Q 3-5 min irregular, difficult to pick up on monitor  Pelvic Assessment:  Dilation - 7-8   Effacement - 100%    Station - 0  (Clinical Pelvimetry):  GYNECOID    Fetal Presentation:  CEPHALIC    Bishop Scoring System:             0 1 2 3    Dilatation 0 1 - 2 3 - 4 5 or more   Effacement 0 - 30 40 - 50 60 - 70 80 or more   Station -3 -2 -1,0 +1,+2   Position Posterior Mid Anterior    Consistency Firm Medium Soft        Barnetta Chapel, APRN,MIDWIFE

## 2018-03-13 DIAGNOSIS — F419 Anxiety disorder, unspecified: Secondary | ICD-10-CM

## 2018-03-13 DIAGNOSIS — F329 Major depressive disorder, single episode, unspecified: Secondary | ICD-10-CM

## 2018-03-13 DIAGNOSIS — O99345 Other mental disorders complicating the puerperium: Secondary | ICD-10-CM

## 2018-03-13 MED ORDER — DOCUSATE SODIUM 100 MG CAPSULE
100.0000 mg | ORAL_CAPSULE | Freq: Two times a day (BID) | ORAL | 0 refills | Status: AC | PRN
Start: 2018-03-13 — End: ?

## 2018-03-13 MED ORDER — IBUPROFEN 600 MG TABLET
600.0000 mg | ORAL_TABLET | Freq: Four times a day (QID) | ORAL | 0 refills | Status: AC
Start: 2018-03-14 — End: ?

## 2018-03-13 MED ADMIN — lactated Ringers intravenous solution: ORAL | @ 01:00:00 | NDC 00338011704

## 2018-03-13 NOTE — Consults (Signed)
[facility]  Department of [place] Medicine and Psychiatry  Initial Psychiatry Consult        Patient name:  Shelley Carroll   Chart number:  [id number]  Date of birth:  December 15, 1995  Date of service:  03/13/2018    Requesting service/physician:  OBSTETRICS / Delmar Landau, MD    Chief Complaint: concern for depression     ID:  Shelley Carroll is a 23 y.o. female from [facility] [address]      History of Present Illness:  This patient is a 23 y.o. White female with a past history of anxiety and depression and 61 w 3d admitted to Labor and delivery unit at [facility] for contractions.  After a normal spontaneous vaginal delivery, psychiatry was consulted due to concerns for postpartum depression.  They report patient did not appear to Bond appropriately with child.  They reported she was not engaging and feeding the child or holding the child.  At 1 point, patient stated she "would rather die" than hold her child.  Since that time, patient has better child and help her child.  Patient accompanied by her boyfriend who is father of child.  He has noted improvement in mood and functionality from the patient.    Patient not in communication with her own family.  I did speak with her boyfriend's father Alessandra Grout at [phone].  He noted initial anxiety from the patient but states that it has improved since birth of her child.  He had no concerns about her and stated he witnessed her caring for child.  Of note, boyfriend's parents live about 5 hours away but are in town to visit and offer support.      Patient does have a history depression and anxiety but has not taken any medications for this.  She states her mood is stable.  She had depression in the past have resolved.  She denied specifically problems with sleep, guilt, energy, concentration, anhedonia, suicidal ideation.  No problems with overwhelming levels of anxiety.  No problems with past trauma.  Regarding thought and perception, the patient  denies auditory or visual hallucinations.  The patient is not currently responding to internal stimuli.       SUBSTANCE ABUSE  Alcohol : Denies  Benzodiazapine's: Denies  Tobacco : Cigarettes 0 pack per day  THC: Denies  Opiates : Denies  Stimulants: Denies  Other: Denies      Past Psychiatric History:   Endorses a history of anxiety and depression but does not have a psychiatrist and sees primary doctor.  No past suicide attempts.  No past inpatient admissions.      Past Psychiatric medication Trials:   States she took a medicine for 1 month but does not recall it was.  No other psychiatric medications.      Past Medical History:   Past Medical History:   Diagnosis Date   . Asthma     childhood   . Psychiatric problem     anxiety and depression           Denies history of seizure  Denies history of closed head injury    Past Surgical History:  Past Surgical History:   Procedure Laterality Date   . HX BREAST AUGMENTATION  2016           Allergies:  No Known Allergies    Home Medications:   Reviewed and confirmed with the patient  Medications Prior to Admission     Prescriptions  Methylprednisolone (MEDROL DOSEPACK) 4 mg Oral Tablets, Dose Pack    Take as instructed.    Patient not taking:  Reported on 03/11/2018    prenatal vitamin-iron-folate Tablet    Take 1 Tab by mouth Once a day            Inpatient Medications:    Current Facility-Administered Medications   Medication Dose Route Frequency   . acetaminophen (TYLENOL) tablet  650 mg Oral Q4H PRN   . carboprost tromethamine (HEMABATE) 250 mcg/mL injection  250 mcg Intramuscular Once PRN   . docusate sodium (COLACE) capsule  100 mg Oral 2x/day PRN   . fentaNYL 332mcg/mL + ropivacaine 0.2% in NS 250mL (tot vol) PCEA epidural infusion   Epidural Continuous   . ibuprofen (MOTRIN) tablet  600 mg Oral Q6H   . lanolin-oxyquin-pet, hydrophil (BAG BALM) topical ointment   Apply Topically 2x/day PRN   . lidocaine 1% injection  2 mL Intradermal Once PRN   . LR premix  infusion   Intravenous Continuous   . methylergonovine (METHERGINE) 0.2 mg/mL injection  0.2 mg Intramuscular Once PRN   . mineral oil oral liquid  30 mL Oral Daily PRN   . miSOPROStol (CYTOTEC) tablet  1,000 mcg Rectal Once PRN   . NS flush syringe  2 mL Intracatheter Q8HRS    And   . NS flush syringe  2-6 mL Intracatheter Q1 MIN PRN   . ondansetron (ZOFRAN) 2 mg/mL injection  4 mg Intravenous Once PRN   . witch hazel-glycerin (TUCKS PADS) 12.5 %-50 % topical pads   Apply externally Q1H PRN         Family Psychiatric history:   Mental illness - denied  Suicides - denied  Substance use - denied     Family Medical History:  Family Medical History:     None        Psychosocial History:   Level of Education : hs diploma   Unemployed -   Marital status: single, has a boyfriend   [place]ives [place]: [place]   [place] [id number]  Children: has a daughter  On probation     Review Of System: All 12 systems were reviewed and found to be negative except for the pertinent positives that are mentioned above in the HPI.       Labs:  No results found for any visits on 03/11/18 (from the past 24 hour(s)).         Urine Drug Screen:   BAL:          Physical Exam:   Blood pressure 125/73, pulse 82, temperature 36.6 C (97.9 F), resp. rate 18, height 1.626 m (5\' 4" ), weight 68 kg (150 lb), SpO2 100 %, unknown if currently breastfeeding.  Head: Normocephalic, Atraumatic  Eye: Pupils are equal, round   Mouth & Oropharynx: Moist mucosal membranes  Neck: Supple   Heart: S1 and S2  Lung: CTAB  Abdomen: Soft, nontender, nondistended, BS present   Skin: Warm, dry, intact  Neuro: Grossly intact      Mental Status Examination:  This is an alert and oriented 23 y.o. female  who appears stated age, wearing street clothes and well groomed.  she is cooperative, pleasant, and makes good eye contact. No psychomotor abnormalities present. Speech is regular rate and volume, clear and articulate. Mood is ok and affect is consistent and within normal  range. Thought process is goal-directed with appropriate thought content. Denies SI/HI. No hallucinations, delusions, or paranoia voiced or  suspected. Patient does not appear to be responding to internal stimuli. Attention is adequate for the conversation and she displays a fair ability to think abstractly. Judgement and insight is fair.         Assessment:   This is a 23 year old female with history of anxiety and depression has been under control without medication admitted to labor and delivery unit with complaints of contractions.  After normal spontaneous vaginal delivery, team was concerned about appropriate bonding between mother and infant and concerns for postpartum depression.  Day of since noted improvement in communication between mother and infant.  Patient, her boyfriend, and boyfriend's family have no concerns.  Patient has an outpatient provider and is agreeable to contact Psychiatry of further needed.  There are no past suicide attempts or inpatient admissions.  No imminent concerns for safety at this time.  No need for inpatient admission.    Axis I unspecified anxiety vs adjustment disorder      Plan:   -Not appropriate for inpatient admission   -no imminent safety concerns  -staff, patient, family note improved bond between mom and baby.  Initially was fatigued and did not bond well.  -patient has outpatient provider and we discussed that she should contact psychiatry if symptoms return      Judithann Graves, MD 03/13/2018, 12:01  [facility]  Behavioral Medicine and Psychiatry     I saw and examined the patient. I was present and participated in the development of the treatment plan. I reviewed the resident's note. I agree with the findings and plan of care as documented in the resident's note.  Any exceptions/ additions are edited/noted.         Howard Crews, MD

## 2018-03-13 NOTE — Discharge Summary (Signed)
[facility]  DISCHARGE SUMMARY    PATIENT NAME:  Shelley Carroll, Shelley Carroll  MRN:  [id number]  DOB:  1995-10-12    ENCOUNTER DATE:  03/11/2018  INPATIENT ADMISSION DATE: 03/11/2018  DISCHARGE DATE:  03/13/2018    ATTENDING PHYSICIAN: Holley Bouche, MD  SERVICE: OBSTETRICS  PRIMARY CARE PHYSICIAN: No Pcp       PRIMARY DISCHARGE DIAGNOSIS:   Active Hospital Problems    Diagnosis Date Noted   . NSVD (normal spontaneous vaginal delivery) [O80] 03/12/2018      Resolved Hospital Problems    Diagnosis    . Normal labor [O80, Z37.9]    . Term pregnancy [Z34.90]      There are no active non-hospital problems to display for this patient.       DISCHARGE MEDICATIONS:     Current Discharge Medication List      START taking these medications.      Details   docusate sodium 100 mg Capsule  Commonly known as:  COLACE   100 mg, Oral, 2 TIMES DAILY PRN  Qty:  60 Cap  Refills:  0     Ibuprofen 600 mg Tablet  Commonly known as:  MOTRIN  Start taking on:  March 14, 2018   600 mg, Oral, EVERY 6 HOURS  Qty:  60 Tab  Refills:  0        CONTINUE these medications - NO CHANGES were made during your visit.      Details   Methylprednisolone 4 mg Tablets, Dose Pack  Commonly known as:  MEDROL DOSEPACK   Take as instructed.  Qty:  21 Tab  Refills:  0     prenatal vitamin-iron-folate Tablet   1 Tab, Oral, DAILY  Refills:  0          Discharge med list refreshed?  YES       ALLERGIES:  No Known Allergies      HOSPITAL PROCEDURE(S):   Bedside Procedures:  No orders of the defined types were placed in this encounter.    Surgical     REASON FOR HOSPITALIZATION AND HOSPITAL COURSE     BRIEF HPI:  This is a 23 y.o., female admitted at term for labor. Patient reported she got her [facility] but reported to [facility] at term in labor. Patient underwent an uncomplicated SVD. She delivered a female infant weighing 3040 gm APGARs 5 & 9. Patient met all post partum milestones. Post partum there were concerns about patients mood and her bonding with baby.  Psych was consulted, no imminent safety concerns. Patient will be d/c to home in stable condition. She will follow up in 1 week and 6 weeks.        CONDITION ON DISCHARGE:  A. Ambulation: Full ambulation  B. Self-care Ability: Complete  C. Cognitive Status Alert and Oriented x 3  D. Code status at discharge:   Code Status Information     Code Status    Full Code                 LINES/DRAINS/WOUNDS AT DISCHARGE:   Patient Lines/Drains/Airways Status    Active Line / Dialysis Catheter / Dialysis Graft / Drain / Airway / Wound     None                DISCHARGE DISPOSITION:  Home discharge      DISCHARGE INSTRUCTIONS:  Follow-up Information     Gynecology, [place] In 1 week.  Specialty:  EXTERNAL  Why:  post partum mood check  Contact information:  [address]  [place] [address]  [phone]                    DISCHARGE INSTRUCTION - [place]    Call [phone] and ask for the [facility] physician on call for any of the following symptoms:  . Fever of 100.5 or higher   . Red, warm painful area in either breast  . Urgency, frequency, burning, pain on urination small amounts  . Warm, tender area on either leg  . Purulent discharge from incision  . Feelings of depression, irritability or anxiety  . Vaginal bleeding more than one pad an hour    Post Partum Instuctions:  . Nothing in the vagina for 6 weeks  . You may drive in 5 days if you are no longer taking the pain medication  . Continue taking your prenatal vitamins          Octavio Graves, DO    Copies sent to Care Team       Relationship Specialty Notifications Start End    Pcp, No PCP - General   06/07/14             Referring providers can utilize [email or web address] to access their referred [place] patient's information.

## 2018-03-13 NOTE — Nurses Notes (Deleted)
RN at bedside for safety check. RN asked when it was time to feed the baby, father stated, "She needs to feed now. I can feed her." RN replied with, "I'm going to have the mother feed the baby this time." Mother looked at father as if he should feed the mother; father did not acknowledge look. This is first feeding attempt performed by mother. Mother positioned to feed newborn. Mother educated on how to hold newborn, estimated amount to feed newborn, and how often to burp newborn. Mother verbalized understanding and demonstrated feeding hold and burping. Mother appears to make eye contact and show more interest in newborn as the night progesses. Will continue to monitor.

## 2018-03-13 NOTE — Progress Notes (Signed)
[place] Department of Obstetrics & Gynecology  CNM Postpartum Progress Note    Name: Shelley Carroll  MRN: [id number]  Date: 03/13/18    Shelley Carroll is a 23 y.o. who is 1 days PP from a SVD    Pt feeling well, ambulating and urinating without difficulty. Reports decreased lochia, denies large clots or soaking a pad per hour.  Pain is well managed with PO meds.  Denies: HA, visual changes, light headedness, dizziness, SOB, fever, chills, unilateral leg cramping/swelling, N/V.   Baby girl, bottlefeeding.  There are concerns about maternal bonding with baby from nursing staff.  Psych has been consulted and is to come today.    Pt was in bed with baby in bedside bassinet.      Her prenatal course was significant for:  Rh positive / Antibody Negative  Rubella: Immune    Objective:   BP 139/67   Pulse 82   Temp 36.9 C (98.4 F)   Resp 18   Ht 1.626 m (5\' 4" )   Wt 68 kg (150 lb)   SpO2 100%   Breastfeeding Unknown   BMI 25.75 kg/m       Filed Vitals:    03/12/18 1605 03/12/18 1955 03/13/18 0003 03/13/18 0328   BP: (!) 102/58 129/79 (!) 144/76 139/67   Pulse: 90 87 84 82   Resp: 18 18 18 18    Temp:  36.4 C (97.5 F) 36.2 C (97.2 F) 36.9 C (98.4 F)   SpO2:          General:     NAD. Resting comfortably in room.   Breasts:  Not engorged, no evidence of mastitis.   Lungs:  No use of accessory muscles. Easy unlabored breathing.   Abdomen:  Soft with firm fundus, appropriately tender.     Skin: Warm, dry, intact.    Extremities:  Extremities normal, no cyanosis. Edema trace   Neurologic:  Oriented, normal mood.   Perineum:  No evidence hematoma.     Assessment/Plan:     1.  Stable Day 1 PP SVD  Education: warning signs, reasons to call, signs of PP depression, mastitis, expected hospital course, PP follow up, signs of preeclampsia, contraception  Motrin and tylenol PRN pain/cramping  Colace PRN constipation  Reviewed R/B/A of all forms of birth control:  undecided  D/C tomorrow  FU 6wks for PPV    2.   Anxiety/depression    Psych consult for lack of bonding    Mood check postpartum        The patient was evaluated and the plan formulated independently per APRN scope of practice and licensure. Attending available on-site for consultation as needed.   Hillard Danker, APRN, CNM 03/13/2018 08:44      Kerman Passey, MD  03/13/2018, 12:45

## 2018-03-14 MED ADMIN — ibuprofen 600 mg tablet: ORAL | @ 03:00:00

## 2018-03-14 NOTE — Care Plan (Signed)
Problem: Adult Inpatient Plan of Care  Goal: Plan of Care Review  Outcome: Adequate for Discharge  Goal: Patient-Specific Goal (Individualized)  Outcome: Adequate for Discharge  Goal: Absence of Hospital-Acquired Illness or Injury  Outcome: Adequate for Discharge  Goal: Optimal Comfort and Wellbeing  Outcome: Adequate for Discharge  Goal: Rounds/Family Conference  Outcome: Adequate for Discharge     Problem: Bleeding (Labor)  Goal: Hemostasis  Outcome: Adequate for Discharge     Problem: Change in Fetal Wellbeing (Labor)  Goal: Stable Fetal Wellbeing  Outcome: Adequate for Discharge     Problem: Delayed Labor Progression (Labor)  Goal: Effective Progression to Delivery  Outcome: Adequate for Discharge     Problem: Infection (Labor)  Goal: Absence of Infection Signs and Symptoms  Outcome: Adequate for Discharge     Problem: Labor Pain (Labor)  Goal: Acceptable Pain Control  Outcome: Adequate for Discharge     Problem: Uterine Tachysystole (Labor)  Goal: Normal Uterine Contraction Pattern  Outcome: Adequate for Discharge  Pt d/c to home, AVS discussed, discharge instructions given per Charge nurse Angie. Pt d/c to home with baby and s/o "[name]". [name] to transport.

## 2018-03-14 NOTE — Care Plan (Signed)
Problem: Adult Inpatient Plan of Care  Goal: Plan of Care Review  03/14/2018 0058 by Peggye Fothergill, RN  Outcome: Ongoing (see interventions/notes)  03/14/2018 0057 by Peggye Fothergill, RN  Outcome: Ongoing (see interventions/notes)     Problem: Adult Inpatient Plan of Care  Goal: Patient-Specific Goal (Individualized)  03/14/2018 0058 by Peggye Fothergill, RN  Outcome: Ongoing (see interventions/notes)  03/14/2018 0057 by Peggye Fothergill, RN  Outcome: Ongoing (see interventions/notes)     Problem: Adult Inpatient Plan of Care  Goal: Absence of Hospital-Acquired Illness or Injury  03/14/2018 0058 by Peggye Fothergill, RN  Outcome: Ongoing (see interventions/notes)  03/14/2018 0057 by Peggye Fothergill, RN  Outcome: Ongoing (see interventions/notes)     Problem: Adult Inpatient Plan of Care  Goal: Optimal Comfort and Wellbeing  03/14/2018 0058 by Peggye Fothergill, RN  Outcome: Ongoing (see interventions/notes)  03/14/2018 0057 by Peggye Fothergill, RN  Outcome: Ongoing (see interventions/notes)     Continue with current plan of care including safety precautions, monitor patient for post partum bleeding as well as pain.  Encourage pt interaction with her baby including all aspects of care/feeding. CWRN

## 2018-03-14 NOTE — Progress Notes (Signed)
[place] Department of Obstetrics & Gynecology  CNM Postpartum Progress Note    Name: Shelley Carroll  MRN: [id number]  Date: 03/14/18    Shelley Carroll is a 23 y.o. who is 2 days PP from a SVD.  HPI: Presented to triage with contractions. Progressed spontaneously resulting in SVD on 03/12/2018 at 0454. Female, 3040g. Apgars 5/9/9. She had a mildly elevated BP during her triage evaluation, but this was likely related to pain. She has otherwise been normotensive.  Patient obtained her prenatal care at [facility]. Presented in labor to [facility] and opted to stay. Concerns regarding maternal bonding expressed by staff. Psych was consulted, and did not feel she is a threat to herself or her baby, and feel she is stable for home D/C.     Laceration: None, declines exam  Ambulating: Up ad lib w/o symptoms  Bleeding: Normal bleeding without clots. Denies soaking through a pad per hour.  Pain: Managed w/ PO medications.   PPBC: Undecided. Doesn't want anything that will make her gain weight. Asks about IUD.   Denies: HA, visual changes, light headedness, dizziness, SOB, fever, chills, unilateral leg cramping/swelling, N/V    History  OB History     Gravida   4    Para   1    Term   0    Preterm   0    AB   3    Living   1       SAB   0    TAB   3    Ectopic   0    Multiple   0    Live Births   1               Past Medical History:   Diagnosis Date   . Asthma     childhood   . Psychiatric problem     anxiety and depression     Past Surgical History:   Procedure Laterality Date   . HX BREAST AUGMENTATION  2016     Current Facility-Administered Medications   Medication Dose Route Frequency Provider Last Rate Last Dose   . acetaminophen (TYLENOL) tablet  650 mg Oral Q4H PRN Barnetta Chapel, APRN,MIDWIFE       . docusate sodium (COLACE) capsule  100 mg Oral 2x/day PRN Barnetta Chapel, APRN,MIDWIFE   100 mg at 03/12/18 1959   . ibuprofen (MOTRIN) tablet  600 mg Oral Q6H Barnetta Chapel, APRN,MIDWIFE   600 mg at 03/14/18 0251   .  lanolin-oxyquin-pet, hydrophil (BAG BALM) topical ointment   Apply Topically 2x/day PRN Barnetta Chapel, APRN,MIDWIFE       . mineral oil oral liquid  30 mL Oral Daily PRN Barnetta Chapel, APRN,MIDWIFE   30 mL at 03/12/18 0601   . witch hazel-glycerin (TUCKS PADS) 12.5 %-50 % topical pads   Apply externally Q1H PRN Barnetta Chapel, APRN,MIDWIFE         No Known Allergies    Objective:   BP 133/76   Pulse 85   Temp 36.5 C (97.7 F)   Resp 18   Ht 1.626 m (5\' 4" )   Wt 68 kg (150 lb)   SpO2 100%   Breastfeeding Unknown   BMI 25.75 kg/m     Filed Vitals:    03/13/18 0925 03/13/18 1756 03/13/18 2149 03/14/18 0010   BP: 125/73 135/81 128/71 133/76   Pulse: 82 76 89 85   Resp: 18 20 18     Temp: 36.6 C (97.9  F) 36.4 C (97.5 F) 36.5 C (97.7 F)    SpO2:          General:     No acute distress. Standing comfortably at infant's bedside.    Lungs:  No use of accessory muscles. Easy unlabored breathing.   Abdomen:  Soft, non-tender.    Skin: Warm, dry, intact.    Extremities:  Extremities normal, atraumatic, no cyanosis. Edema negative.   Neurologic:  Oriented, normal mood, appropriate maternal-child bonding behaviors present.   Perineum:  No evidence of hematoma by mannerisms.  Declines exam.   Uterus:  Firm, midline per nursing assessment. Declines repeat exam.     Assessment:     Stable Day 2 PP SVD  Breastfeeding, Hx of Breast Augmentation  Undecided on Contraceptive Plan    Plan:     1. Education: warning signs, reasons to call, signs of PP depression, expected hospital course, PP follow up, signs of preeclampsia  2. Counseled regarding Hx of Breast Augmentation with breastfeeding. Reviewed signs of mastitis, how to promote lactation, and if breastfeeding no longer desired how to suppress milk production.  3. Referred to [facility] within discharge pathway.  4. Contraception: Undecided. Wants to keep as natural as possible.   - Offered POP Rx and she declined.   - Reviewed risks with short interval  pregnancy.   - Reviewed quick return to fertility after delivery.   - Advised condom use if sexually active prior to [facility] visit.   5. Reviewed importance of PP visit. She would prefer to F/U with [place].   6. Recommend 2 week PP mood check.   7. Follow up: 6 weeks for PP visit  8. Discharge home today.    The patient was evaluated and the plan formulated independently per APRN scope of practice and licensure. Attending available on-site for consultation as needed.   Solon Augusta, APRN, CNM, WHNP-BC

## 2018-03-14 NOTE — Care Management Notes (Signed)
MSW met with pt and father of baby.  Assisted in witnessing and notarizing paternity affidavits.  Copy given to unit clerk, patient, and father of baby.

## 2018-03-15 ENCOUNTER — Telehealth (INDEPENDENT_AMBULATORY_CARE_PROVIDER_SITE_OTHER): Payer: Self-pay | Admitting: Clinical

## 2018-03-15 NOTE — Nursing Note (Signed)
Called pt to schedule with [facility].     Pt stated she did not want to schedule at this time because she was feeling better, that she would contact her primary OB ([facility]). I asked her if they had a counslor at their office, she stated she was unsure but would contact the office to find out. I advised pt to call us back and to schedule if [facility] didn't have a counselor. Pt advised understanding.     Pt referred by Solon Augusta.  Being referred for risk for depressed mood during postpartum period, pt exhibited poor infant bonding immediately after delivery. Limited prenatal care.

## 2018-03-31 ENCOUNTER — Encounter (HOSPITAL_BASED_OUTPATIENT_CLINIC_OR_DEPARTMENT_OTHER): Payer: Self-pay | Admitting: Obstetrics & Gynecology

## 2018-04-26 ENCOUNTER — Telehealth (HOSPITAL_BASED_OUTPATIENT_CLINIC_OR_DEPARTMENT_OTHER): Payer: Self-pay | Admitting: Advanced Practice Midwife

## 2018-04-26 NOTE — Telephone Encounter (Signed)
Attempted to call patient to convert appt to telehealth visit on 04/27/18.  Left message for patient to call back. Audrea Muscat, RN  04/26/2018, 11:31

## 2018-04-27 ENCOUNTER — Encounter (HOSPITAL_BASED_OUTPATIENT_CLINIC_OR_DEPARTMENT_OTHER): Payer: Self-pay | Admitting: Advanced Practice Midwife
